# Patient Record
Sex: Male | Born: 1990 | Race: White | Hispanic: No | Marital: Married | Smoking: Never smoker
Health system: Southern US, Community
[De-identification: ages and names within clinical notes are randomized; demographics above are authoritative.]

## PROBLEM LIST (undated history)

## (undated) DIAGNOSIS — N200 Calculus of kidney: Secondary | ICD-10-CM

## (undated) DIAGNOSIS — M1990 Unspecified osteoarthritis, unspecified site: Secondary | ICD-10-CM

## (undated) DIAGNOSIS — T7840XA Allergy, unspecified, initial encounter: Secondary | ICD-10-CM

## (undated) DIAGNOSIS — F419 Anxiety disorder, unspecified: Secondary | ICD-10-CM

## (undated) HISTORY — DX: Unspecified osteoarthritis, unspecified site: M19.90

## (undated) HISTORY — DX: Anxiety disorder, unspecified: F41.9

## (undated) HISTORY — PX: FRACTURE SURGERY: SHX138

## (undated) HISTORY — DX: Allergy, unspecified, initial encounter: T78.40XA

---

## 2016-12-15 ENCOUNTER — Emergency Department (HOSPITAL_COMMUNITY): Payer: BLUE CROSS/BLUE SHIELD

## 2016-12-15 ENCOUNTER — Emergency Department (HOSPITAL_COMMUNITY)
Admission: EM | Admit: 2016-12-15 | Discharge: 2016-12-15 | Disposition: A | Discharge status: Home / Self Care | Payer: BLUE CROSS/BLUE SHIELD | Attending: Emergency Medicine | Admitting: Emergency Medicine

## 2016-12-15 ENCOUNTER — Encounter (HOSPITAL_COMMUNITY): Payer: Self-pay | Admitting: Emergency Medicine

## 2016-12-15 DIAGNOSIS — Z79899 Other long term (current) drug therapy: Secondary | ICD-10-CM | POA: Diagnosis not present

## 2016-12-15 DIAGNOSIS — R109 Unspecified abdominal pain: Secondary | ICD-10-CM | POA: Diagnosis present

## 2016-12-15 DIAGNOSIS — N201 Calculus of ureter: Secondary | ICD-10-CM

## 2016-12-15 HISTORY — DX: Calculus of kidney: N20.0

## 2016-12-15 LAB — URINALYSIS, ROUTINE W REFLEX MICROSCOPIC
BACTERIA UA: NONE SEEN
BILIRUBIN URINE: NEGATIVE
Glucose, UA: NEGATIVE mg/dL
Ketones, ur: NEGATIVE mg/dL
Leukocytes, UA: NEGATIVE
Nitrite: NEGATIVE
PH: 5 (ref 5.0–8.0)
Protein, ur: 30 mg/dL — AB
SPECIFIC GRAVITY, URINE: 1.024 (ref 1.005–1.030)

## 2016-12-15 LAB — CBC WITH DIFFERENTIAL/PLATELET
BASOS PCT: 0 %
Basophils Absolute: 0 10*3/uL (ref 0.0–0.1)
EOS ABS: 0.1 10*3/uL (ref 0.0–0.7)
Eosinophils Relative: 0 %
HEMATOCRIT: 39.1 % (ref 39.0–52.0)
HEMOGLOBIN: 13.2 g/dL (ref 13.0–17.0)
LYMPHS ABS: 1.2 10*3/uL (ref 0.7–4.0)
Lymphocytes Relative: 9 %
MCH: 29.3 pg (ref 26.0–34.0)
MCHC: 33.8 g/dL (ref 30.0–36.0)
MCV: 86.9 fL (ref 78.0–100.0)
MONOS PCT: 7 %
Monocytes Absolute: 0.9 10*3/uL (ref 0.1–1.0)
NEUTROS ABS: 10.6 10*3/uL — AB (ref 1.7–7.7)
Neutrophils Relative %: 84 %
Platelets: 179 10*3/uL (ref 150–400)
RBC: 4.5 MIL/uL (ref 4.22–5.81)
RDW: 12.8 % (ref 11.5–15.5)
WBC: 12.8 10*3/uL — AB (ref 4.0–10.5)

## 2016-12-15 LAB — COMPREHENSIVE METABOLIC PANEL
ALBUMIN: 4.6 g/dL (ref 3.5–5.0)
ALK PHOS: 59 U/L (ref 38–126)
ALT: 59 U/L (ref 17–63)
AST: 43 U/L — ABNORMAL HIGH (ref 15–41)
Anion gap: 7 (ref 5–15)
BILIRUBIN TOTAL: 0.7 mg/dL (ref 0.3–1.2)
BUN: 20 mg/dL (ref 6–20)
CALCIUM: 8.8 mg/dL — AB (ref 8.9–10.3)
CO2: 26 mmol/L (ref 22–32)
CREATININE: 1.1 mg/dL (ref 0.61–1.24)
Chloride: 103 mmol/L (ref 101–111)
GFR calc Af Amer: >60 mL/min (ref >60)
GFR calc non Af Amer: >60 mL/min (ref >60)
Glucose, Bld: 130 mg/dL — ABNORMAL HIGH (ref 65–99)
Potassium: 3.7 mmol/L (ref 3.5–5.1)
SODIUM: 136 mmol/L (ref 135–145)
TOTAL PROTEIN: 6.8 g/dL (ref 6.5–8.1)

## 2016-12-15 LAB — LIPASE, BLOOD: Lipase: 28 U/L (ref 11–51)

## 2016-12-15 MED ORDER — IBUPROFEN 600 MG PO TABS: 600.0000 mg | ORAL_TABLET | Freq: Four times a day (QID) | ORAL | 0 refills | Status: DC | PRN

## 2016-12-15 MED ORDER — KETOROLAC TROMETHAMINE 30 MG/ML IJ SOLN
30.0000 mg | Freq: Once | INTRAMUSCULAR | Status: AC
  Administered 2016-12-15: 30 mg via INTRAVENOUS
  Filled 2016-12-15: qty 1

## 2016-12-15 MED ORDER — OXYCODONE-ACETAMINOPHEN 5-325 MG PO TABS: 1.0000 | ORAL_TABLET | ORAL | 0 refills | Status: DC | PRN

## 2016-12-15 MED ORDER — HYDROMORPHONE HCL 1 MG/ML IJ SOLN
1.0000 mg | Freq: Once | INTRAMUSCULAR | Status: AC
  Administered 2016-12-15: 1 mg via INTRAVENOUS
  Filled 2016-12-15: qty 1

## 2016-12-15 MED ORDER — ONDANSETRON 4 MG PO TBDP: 4.0000 mg | ORAL_TABLET | Freq: Three times a day (TID) | ORAL | 0 refills | Status: DC | PRN

## 2016-12-15 MED ORDER — TAMSULOSIN HCL 0.4 MG PO CAPS: 0.4000 mg | ORAL_CAPSULE | Freq: Two times a day (BID) | ORAL | 0 refills | Status: DC

## 2016-12-15 NOTE — ED Notes (Signed)
Pt encouraged to attempt urine sample.  

## 2016-12-15 NOTE — ED Provider Notes (Signed)
[facility]-EMERGENCY DEPT Provider Note   CSN: [id number] Arrival date & time: 12/15/16  0506     History   Chief Complaint Chief Complaint  Patient presents with  . Flank Pain    HPI Derrick Pena is a 26 y.o. male who presents with L flank pain. PMH significant for kidney stones. He states he started to have the pain acutely last night. Reports associated N/V which attributes to pain. It is constant, on the L side, non-radiating. Reports associated urinary frequency and urgency. Denies fever, chills, chest pain, SOB, abdominal pain, testicular pain. He does not have a urologist. Has never had to have a stone surgically removed.   HPI  Past Medical History:  Diagnosis Date  . Kidney stones     There are no active problems to display for this patient.   Past Surgical History:  Procedure Laterality Date  . FRACTURE SURGERY         Home Medications    Prior to Admission medications   Medication Sig Start Date End Date Taking? Authorizing Provider  clonazePAM (KLONOPIN) 0.5 MG tablet Take 0.5 mg by mouth daily as needed for anxiety.    Yes Historical Provider, MD  meloxicam (MOBIC) 7.5 MG tablet Take 7.5 mg by mouth daily as needed for pain.    Yes Historical Provider, MD    Family History No family history on file.  Social History Social History  Substance Use Topics  . Smoking status: Never Smoker  . Smokeless tobacco: Never Used  . Alcohol use No     Comment: seldom     Allergies   Patient has no known allergies.   Review of Systems Review of Systems  Constitutional: Negative for chills and fever.  Respiratory: Negative for shortness of breath.   Cardiovascular: Negative for chest pain.  Gastrointestinal: Positive for nausea and vomiting. Negative for abdominal pain.  Genitourinary: Positive for flank pain, frequency and urgency. Negative for testicular pain.  All other systems reviewed and are negative.    Physical Exam Updated Vital Signs BP  113/77   Pulse 83   Temp 97.6 F (36.4 C) (Oral)   Resp 16   Ht 6\' 4"  (1.93 m)   Wt 71.2 kg   SpO2 100%   BMI 19.11 kg/m   Physical Exam  Constitutional: He is oriented to person, place, and time. He appears well-developed and well-nourished. He appears distressed (mild distress due to pain).  HENT:  Head: Normocephalic and atraumatic.  Eyes: Conjunctivae are normal. Pupils are equal, round, and reactive to light. Right eye exhibits no discharge. Left eye exhibits no discharge. No scleral icterus.  Neck: Normal range of motion.  Cardiovascular: Normal rate and regular rhythm.  Exam reveals no gallop and no friction rub.   No murmur heard. Pulmonary/Chest: Effort normal and breath sounds normal. No respiratory distress. He has no wheezes. He has no rales. He exhibits no tenderness.  Abdominal: Soft. Bowel sounds are normal. He exhibits no distension and no mass. There is tenderness. There is no rebound and no guarding. No hernia.  L CVA tenderness  Neurological: He is alert and oriented to person, place, and time.  Skin: Skin is warm and dry.  Psychiatric: He has a normal mood and affect. His behavior is normal.  Nursing note and vitals reviewed.    ED Treatments / Results  Labs (all labs ordered are listed, but only abnormal results are displayed) Labs Reviewed  URINALYSIS, ROUTINE W REFLEX MICROSCOPIC - Abnormal; Notable for  the following:       Result Value   APPearance HAZY (*)    Hgb urine dipstick LARGE (*)    Protein, ur 30 (*)    Squamous Epithelial / LPF 0-5 (*)    All other components within normal limits  CBC WITH DIFFERENTIAL/PLATELET - Abnormal; Notable for the following:    WBC 12.8 (*)    Neutro Abs 10.6 (*)    All other components within normal limits  COMPREHENSIVE METABOLIC PANEL - Abnormal; Notable for the following:    Glucose, Bld 130 (*)    Calcium 8.8 (*)    AST 43 (*)    All other components within normal limits  LIPASE, BLOOD    EKG  EKG  Interpretation None       Radiology Ct Renal Stone Study  Result Date: 12/15/2016 CLINICAL DATA:  Left flank pain for several hours, elevated white blood cell count EXAM: CT ABDOMEN AND PELVIS WITHOUT CONTRAST TECHNIQUE: Multidetector CT imaging of the abdomen and pelvis was performed following the standard protocol without IV contrast. COMPARISON:  None. FINDINGS: Lower chest: No acute abnormality. Hepatobiliary: No focal liver abnormality is seen. No gallstones, gallbladder wall thickening, or biliary dilatation. Pancreas: Unremarkable. No pancreatic ductal dilatation or surrounding inflammatory changes. Spleen: Normal in size without focal abnormality. Adrenals/Urinary Tract: Adrenal glands are within normal limits. The kidneys are well visualized bilaterally. Tiny nonobstructing renal stones are noted in the lower poles bilaterally. Only minimal fullness of the left collecting system is noted although a 5 mm stone is noted at the left UVJ best seen on image number 67 of series 2 causing mild obstructive changes. The bladder is partially decompressed. Stomach/Bowel: Stomach is within normal limits. Appendix appears normal. No evidence of bowel wall thickening, distention, or inflammatory changes. Vascular/Lymphatic: No significant vascular findings are present. No enlarged abdominal or pelvic lymph nodes. Reproductive: Prostate is unremarkable. Other: No abdominal wall hernia or abnormality. No abdominopelvic ascites. Musculoskeletal: No acute or significant osseous findings. IMPRESSION: Bilateral nonobstructing lower pole renal calculi measuring 1-2 mm in diameter. 5 mm left UVJ stone with mild obstructive changes. No other focal abnormality is noted. Electronically Signed   By: Alcide Clever M.D.   On: 12/15/2016 07:07    Procedures Procedures (including critical care time)  Medications Ordered in ED Medications  ketorolac (TORADOL) 30 MG/ML injection 30 mg (30 mg Intravenous Given 12/15/16 1610)   HYDROmorphone (DILAUDID) injection 1 mg (1 mg Intravenous Given 12/15/16 9604)     Initial Impression / Assessment and Plan / ED Course  I have reviewed the triage vital signs and the nursing notes.  Pertinent labs & imaging results that were available during my care of the patient were reviewed by me and considered in my medical decision making (see chart for details).  Clinical Course    26 year old male presents with a left uretal stone. Patient is afebrile, not tachycardic or tachypneic, normotensive, and not hypoxic. CBC remarkable for mild leukocytosis (12.8), CMP remarkable for mild hyperglycemia (130), mild hypocalcemia (8.8), mildly elevated AST (43). Kidney function is normal. UA remarkable for hgb with too numerous to count RBCs. Dilaudid and Toradol given. CT renal remarkable for 5mm left UVJ stone.  On recheck pt reports pain is 0. Will d/c with pain meds, NSAIDs, Flomax, and Zofran. Urology follow up given. Patient is NAD, non-toxic, with stable VS. Patient is informed of clinical course, understands medical decision making process, and agrees with plan. Opportunity for questions provided  and all questions answered. Return precautions given.    Final Clinical Impressions(s) / ED Diagnoses   Final diagnoses:  Left ureteral stone    New Prescriptions New Prescriptions   IBUPROFEN (ADVIL,MOTRIN) 600 MG TABLET    Take 1 tablet (600 mg total) by mouth every 6 (six) hours as needed.   ONDANSETRON (ZOFRAN ODT) 4 MG DISINTEGRATING TABLET    Take 1 tablet (4 mg total) by mouth every 8 (eight) hours as needed for nausea or vomiting.   OXYCODONE-ACETAMINOPHEN (PERCOCET/ROXICET) 5-325 MG TABLET    Take 1 tablet by mouth every 4 (four) hours as needed for severe pain.   TAMSULOSIN (FLOMAX) 0.4 MG CAPS CAPSULE    Take 1 capsule (0.4 mg total) by mouth 2 (two) times daily.     Bethel BornKelly Marie Kiernan Atkerson, PA-C 12/15/16 [id number]    Tilden FossaElizabeth Rees, MD 12/24/16 [phone]

## 2016-12-15 NOTE — Discharge Instructions (Signed)
Drink plenty of fluids °Take narcotic pain medicine as needed. Do not drink alcohol or drive while taking this medicine °Ibuprofen 600mg - Take for pain and inflammation. Take with food three times daily °Take Zofran for nausea °Take Flomax to help stone pass °Return if symptoms are worsening ° °

## 2016-12-15 NOTE — ED Triage Notes (Signed)
Pt c/o flank pain that began about an hour ago; pt endorses urinary urgency and frequency; pt had 1 episode of emesis with EMS; hx of kidney stones

## 2018-08-17 IMAGING — CT CT RENAL STONE PROTOCOL
2 of 3 series · 16 of 46 positions shown, 18 images · non-contrast
Comparison: None.

CLINICAL DATA: Left flank pain for several hours, elevated white
blood cell count

EXAM:
CT ABDOMEN AND PELVIS WITHOUT CONTRAST
TECHNIQUE: Multidetector CT imaging of the abdomen and pelvis was performed
following the standard protocol without IV contrast.

[Series 3: lung · axial · 0.69mm/px · z∈[+1316,+1382]mm · 13 of 39 slices shown, 15 images]
[im 3/39  soft-tissue]
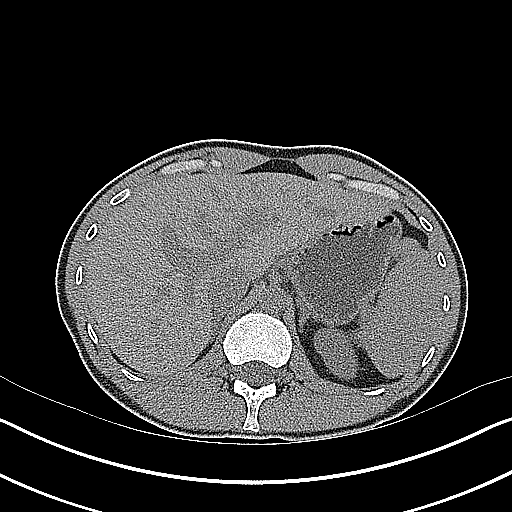
[im 3/39  bone]
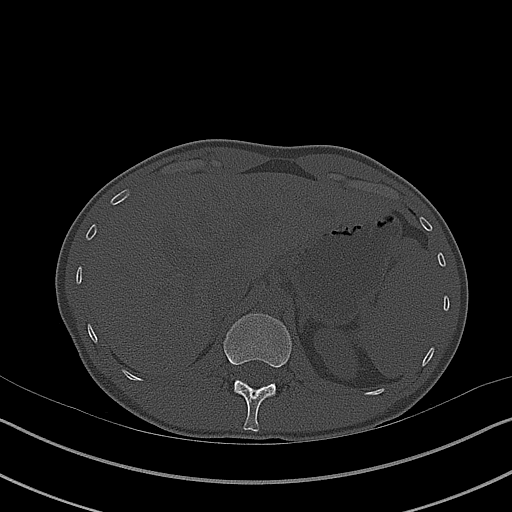
[im 5/39  soft-tissue]
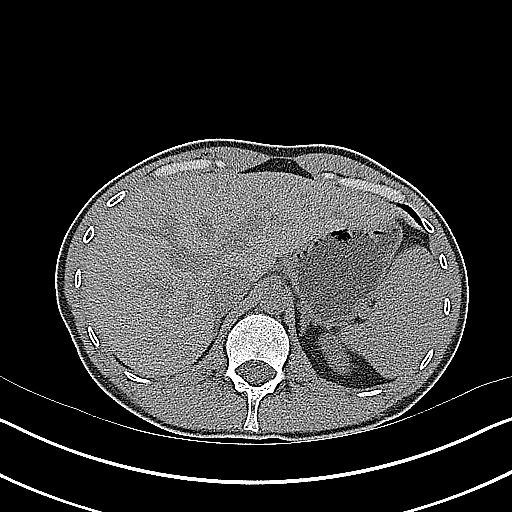
[im 8/39  soft-tissue]
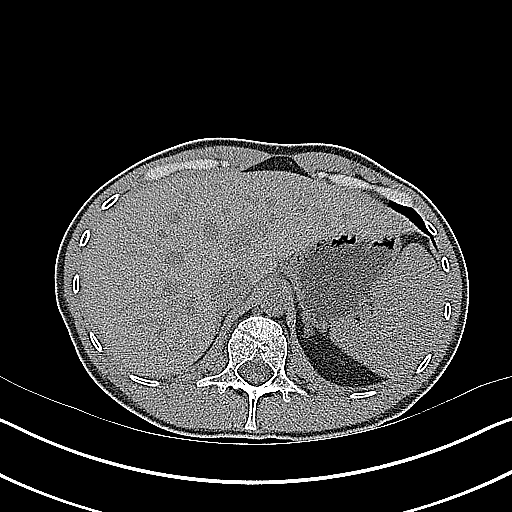
[im 12/39  soft-tissue]
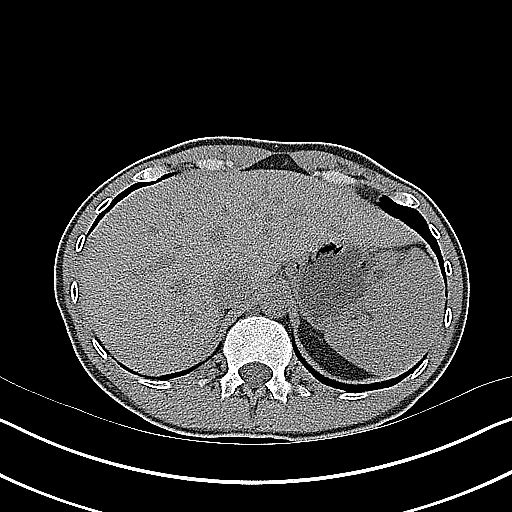
[im 14/39  soft-tissue]
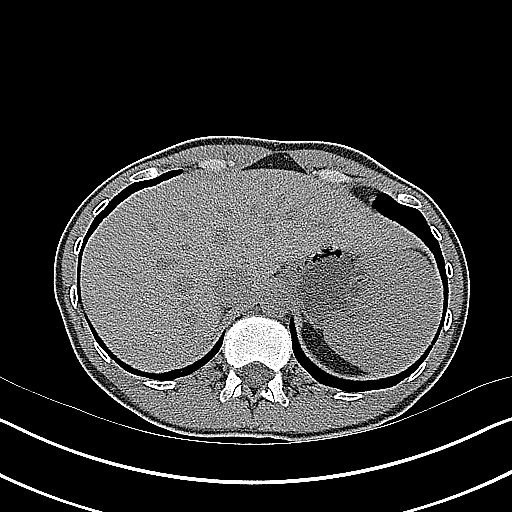
[im 16/39  soft-tissue]
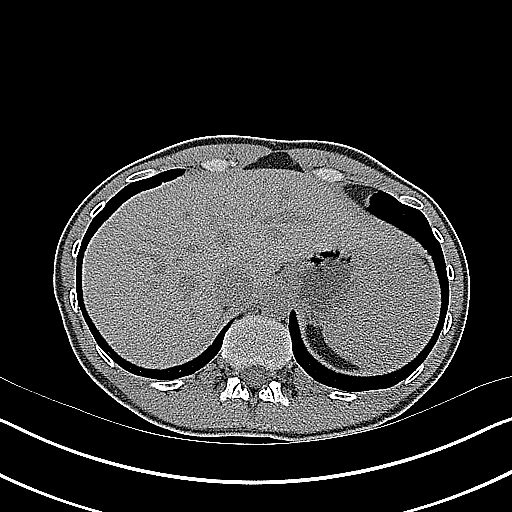
[im 20/39  soft-tissue]
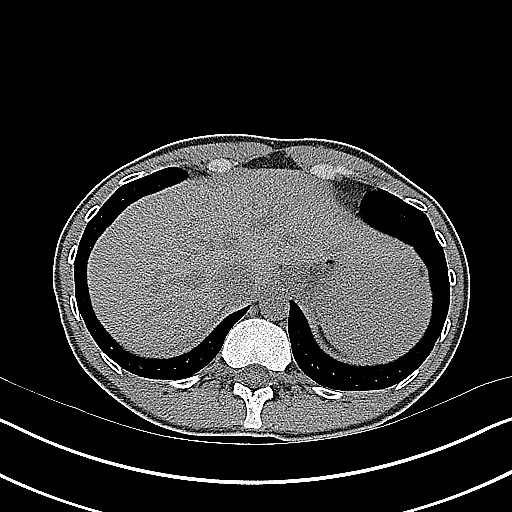
[im 23/39  soft-tissue]
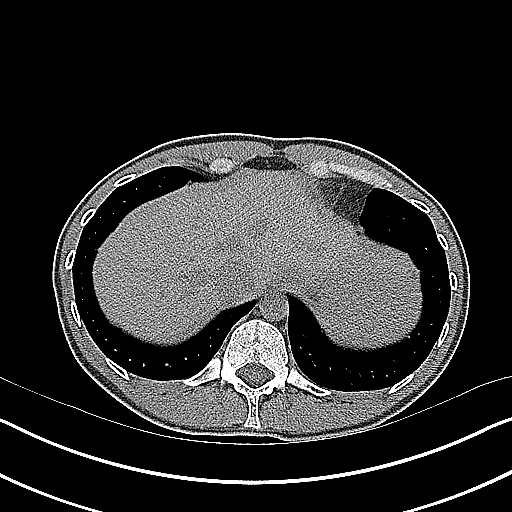
[im 25/39  soft-tissue]
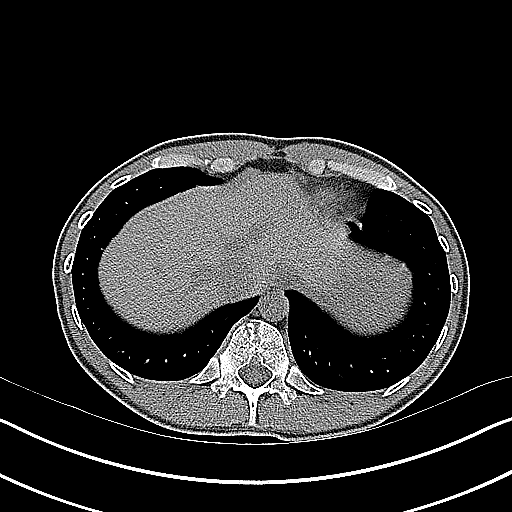
[im 25/39  bone]
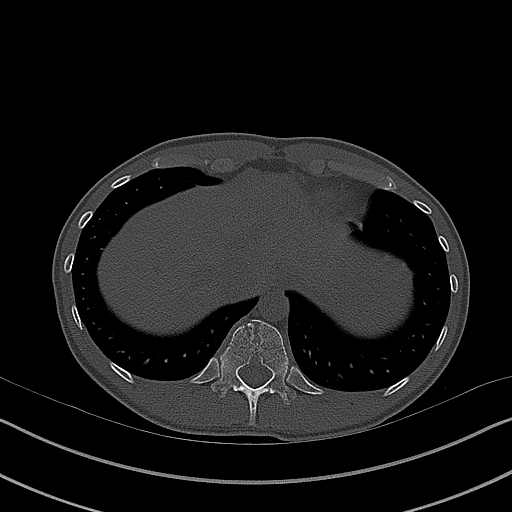
[im 27/39  soft-tissue]
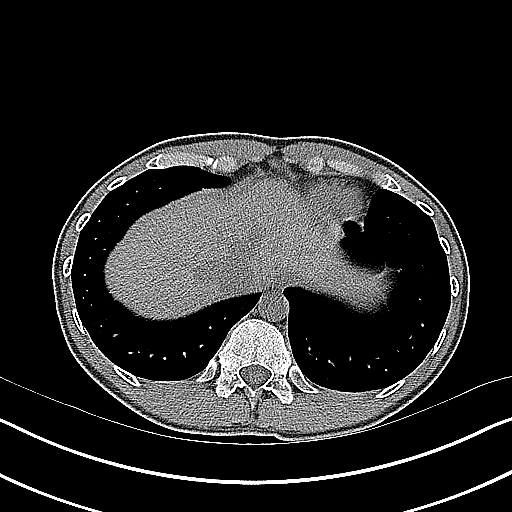
[im 31/39  soft-tissue]
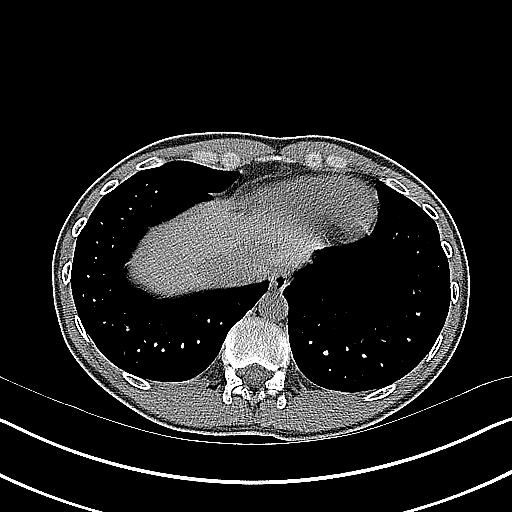
[im 34/39  soft-tissue]
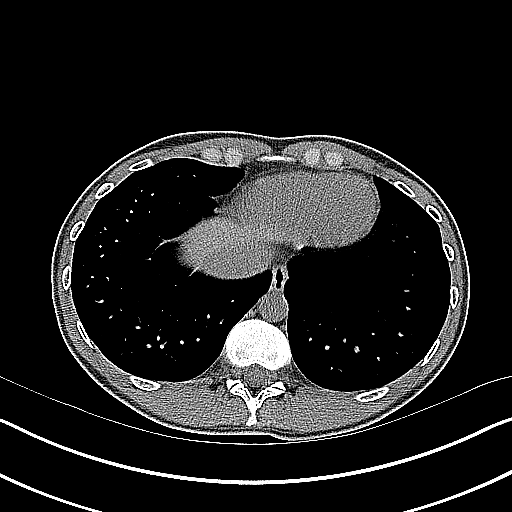
[im 36/39  soft-tissue]
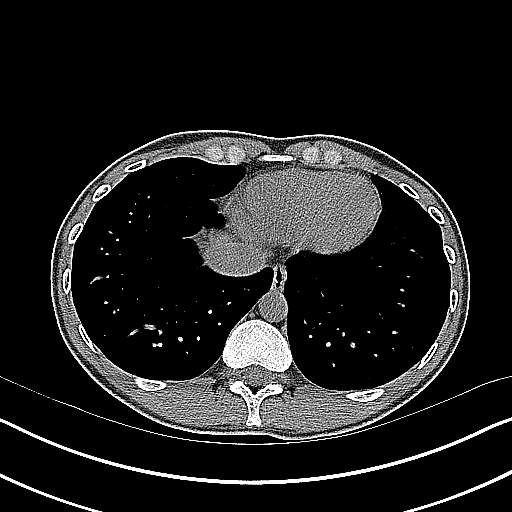

[Series 4: coronal · coronal · 0.66mm/px · 3 of 98 slices shown]
[im 33/98  soft-tissue]
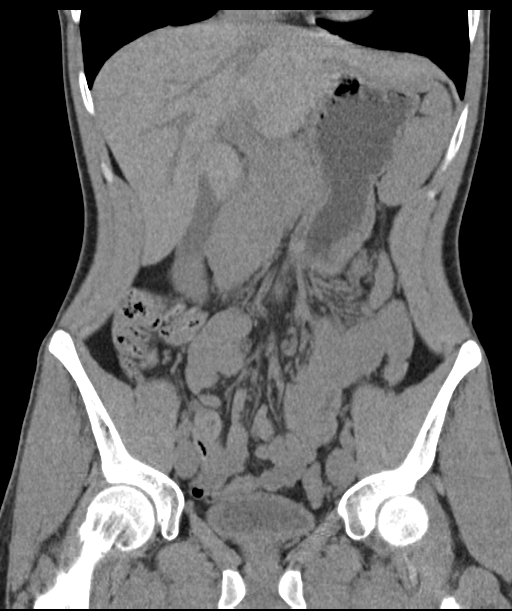
[im 44/98  soft-tissue]
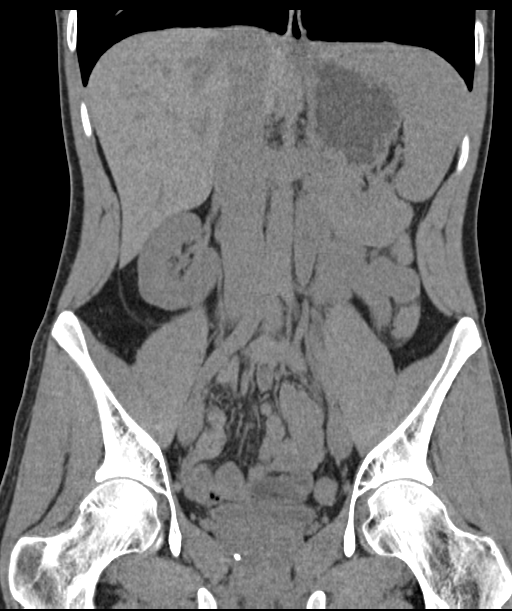
[im 54/98  soft-tissue]
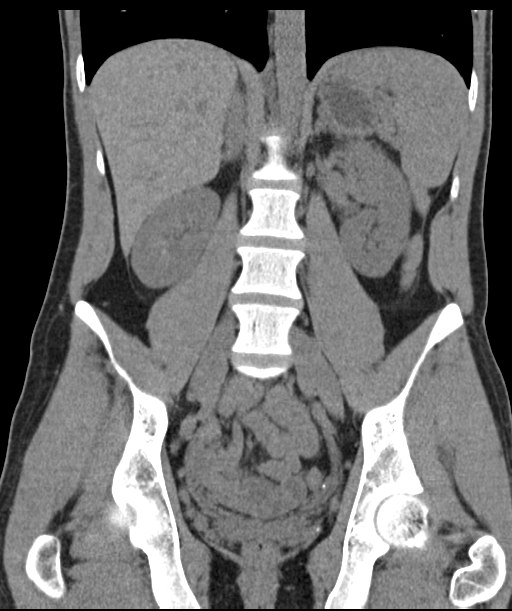

[16 of 46 positions shown; findings below may reference images not displayed]

FINDINGS: Lower chest: No acute abnormality.

Hepatobiliary: No focal liver abnormality is seen. No gallstones,
gallbladder wall thickening, or biliary dilatation.

Pancreas: Unremarkable. No pancreatic ductal dilatation or
surrounding inflammatory changes.

Spleen: Normal in size without focal abnormality.

Adrenals/Urinary Tract: Adrenal glands are within normal limits. The
kidneys are well visualized bilaterally. Tiny nonobstructing renal
stones are noted in the lower poles bilaterally. Only minimal
fullness of the left collecting system is noted although a 5 mm
stone is noted at the left UVJ best seen on image number 67 of
series 2 causing mild obstructive changes. The bladder is partially
decompressed.

Stomach/Bowel: Stomach is within normal limits. Appendix appears
normal. No evidence of bowel wall thickening, distention, or
inflammatory changes.

Vascular/Lymphatic: No significant vascular findings are present. No
enlarged abdominal or pelvic lymph nodes.

Reproductive: Prostate is unremarkable.

Other: No abdominal wall hernia or abnormality. No abdominopelvic
ascites.

Musculoskeletal: No acute or significant osseous findings.
IMPRESSION: Bilateral nonobstructing lower pole renal calculi measuring 1-2 mm
in diameter.

5 mm left UVJ stone with mild obstructive changes. No other focal
abnormality is noted.

## 2023-06-23 DIAGNOSIS — J309 Allergic rhinitis, unspecified: Secondary | ICD-10-CM | POA: Diagnosis not present

## 2023-06-23 DIAGNOSIS — R0981 Nasal congestion: Secondary | ICD-10-CM | POA: Diagnosis not present

## 2023-06-23 DIAGNOSIS — R059 Cough, unspecified: Secondary | ICD-10-CM | POA: Diagnosis not present

## 2024-02-29 NOTE — Progress Notes (Signed)
 Requested records from [facility] and [facility]

## 2024-03-04 ENCOUNTER — Encounter: Payer: Self-pay | Admitting: Nurse Practitioner

## 2024-03-04 ENCOUNTER — Ambulatory Visit (INDEPENDENT_AMBULATORY_CARE_PROVIDER_SITE_OTHER): Payer: Self-pay | Admitting: Nurse Practitioner

## 2024-03-04 VITALS — BP 108/78 | HR 68 | Temp 97.5°F | Ht 76.0 in | Wt 163.0 lb

## 2024-03-04 DIAGNOSIS — G8929 Other chronic pain: Secondary | ICD-10-CM | POA: Insufficient documentation

## 2024-03-04 DIAGNOSIS — M25572 Pain in left ankle and joints of left foot: Secondary | ICD-10-CM

## 2024-03-04 DIAGNOSIS — F411 Generalized anxiety disorder: Secondary | ICD-10-CM | POA: Insufficient documentation

## 2024-03-04 DIAGNOSIS — G5603 Carpal tunnel syndrome, bilateral upper limbs: Secondary | ICD-10-CM | POA: Insufficient documentation

## 2024-03-04 LAB — HM HEPATITIS C SCREENING LAB: HM Hepatitis Screen: NEGATIVE

## 2024-03-04 LAB — HM HIV SCREENING LAB: HM HIV Screening: NEGATIVE

## 2024-03-04 MED ORDER — DULOXETINE HCL 30 MG PO CPEP: 30.0000 mg | ORAL_CAPSULE | Freq: Every day | ORAL | 1 refills | Status: DC

## 2024-03-04 NOTE — Assessment & Plan Note (Signed)
 Chronic bilateral numbness and tingling in fingers and arms likely result from nerve compression, with differential diagnoses including carpal tunnel syndrome or cervical radiculopathy. Refer to orthopedics for evaluation. Prescribe duloxetine 30 capsule daily for nerve pain and anxiety. Recommend wearing a wrist brace at night and during the day if feasible. Recent B12, A1c, ANA, RF negative/normal. Follow-up in 4-6 weeks.

## 2024-03-04 NOTE — Assessment & Plan Note (Signed)
 Chronic left ankle popping occurs without pain or swelling and is likely benign. Recommend daily alphabet exercises to strengthen ankle tendons. Advise monitoring for any pain, swelling, or instability. X-ray negative.

## 2024-03-04 NOTE — Patient Instructions (Addendum)
 It was great to see you!  Wear a wrist brace at night (you can wear during the day as well)   Start cymbalta 1 capsule daily  Spell the alphabet with your foot daily   I am placing a referral to ortho   Let's follow-up in 4-6 weeks, sooner if you have concerns.  If a referral was placed today, you will be contacted for an appointment. Please note that routine referrals can sometimes take up to 3-4 weeks to process. Please call our office if you haven't heard anything after this time frame.  Take care,  Rodman Pickle, NP

## 2024-03-04 NOTE — Assessment & Plan Note (Signed)
 He experiences mild anxiety related to health issues. Duloxetine 30mg  daily is prescribed for both anxiety and nerve pain. Discussed possible side effects. Follow-up in 4-6 weeks.

## 2024-03-04 NOTE — Progress Notes (Signed)
 New Patient Visit  BP 108/78 (BP Location: Left Arm, Patient Position: Sitting, Cuff Size: Normal)   Pulse 68   Temp (!) 97.5 F (36.4 C)   Ht 6\' 4"  (1.93 m)   Wt 163 lb (73.9 kg)   SpO2 97%   BMI 19.84 kg/m    Subjective:    Patient ID: Derrick Pena, male    DOB: Apr 07, 1991, 33 y.o.   MRN: [id number]  CC: Chief Complaint  Patient presents with   Establish Care    NP. Est. Care, concerns with numbness and tingling in hands and fingers and legs-constant lifting, standing at work, popping at back of ankles, anxiety    HPI: Derrick Pena is a 33 y.o. male presents for new patient visit to establish care.  Introduced to Publishing rights manager role and practice setting.  All questions answered.  Discussed provider/patient relationship and expectations.  Discussed the use of AI scribe software for clinical note transcription with the patient, who gave verbal consent to proceed.  History of Present Illness   Derrick Pena, a patient with a history of kidney stones and a previous arm surgery, presents with a chief complaint of persistent numbness and tingling in his hands that has been ongoing for about a year. The numbness is more pronounced in the pointer fingers but affects all fingers. He also experiences stiffness in the joints. The symptoms are constant and do not come and go. However, they seem to improve with physical activity and worsen when he is at rest, leading to increased stiffness.  In addition to the hand symptoms, he also reports a constant popping in the left ankle. There is no associated pain or swelling, and he has had an x-ray done previously which did not reveal any significant findings. He also mentions feeling fatigued and experiencing some anxiety, but denies any other significant symptoms.  His work involves physical labor, including lifting, which could potentially contribute to the symptoms. He has tried over-the-counter medication for the numbness and tingling,  which provided some relief. He has not had any recent falls or injuries, and denies any significant changes in lifestyle or habits.        03/04/2024    1:18 PM  Depression screen PHQ 2/9  Decreased Interest 0  Down, Depressed, Hopeless 0  PHQ - 2 Score 0  Altered sleeping 1  Tired, decreased energy 1  Change in appetite 0  Feeling bad or failure about yourself  0  Trouble concentrating 0  Moving slowly or fidgety/restless 0  Suicidal thoughts 0  PHQ-9 Score 2  Difficult doing work/chores Somewhat difficult      03/04/2024    1:18 PM  GAD 7 : Generalized Anxiety Score  Nervous, Anxious, on Edge 1  Control/stop worrying 1  Worry too much - different things 1  Trouble relaxing 1  Restless 0  Easily annoyed or irritable 0  Afraid - awful might happen 0  Total GAD 7 Score 4  Anxiety Difficulty Somewhat difficult    Past Medical History:  Diagnosis Date   Allergy    Anxiety    Arthritis    Kidney stones     Past Surgical History:  Procedure Laterality Date   FRACTURE SURGERY      Family History  Problem Relation Age of Onset   Heart attack Mother      Social History   Tobacco Use   Smoking status: Never   Smokeless tobacco: Never  Vaping Use   Vaping status: Never  Used  Substance Use Topics   Alcohol use: No    Comment: seldom   Drug use: No    No current outpatient medications on file prior to visit.   No current facility-administered medications on file prior to visit.     Review of Systems  Constitutional:  Positive for fatigue. Negative for fever.  HENT:  Positive for congestion. Negative for sinus pain and sore throat.   Respiratory: Negative.    Cardiovascular: Negative.   Gastrointestinal: Negative.   Genitourinary: Negative.   Musculoskeletal:        Left ankle popping  Skin: Negative.   Neurological:  Positive for numbness (and tingling in hands). Negative for dizziness.  Psychiatric/Behavioral:  Negative for dysphoric mood. The  patient is nervous/anxious.       Objective:    BP 108/78 (BP Location: Left Arm, Patient Position: Sitting, Cuff Size: Normal)   Pulse 68   Temp (!) 97.5 F (36.4 C)   Ht 6\' 4"  (1.93 m)   Wt 163 lb (73.9 kg)   SpO2 97%   BMI 19.84 kg/m   Wt Readings from Last 3 Encounters:  03/04/24 163 lb (73.9 kg)  12/15/16 157 lb (71.2 kg)    BP Readings from Last 3 Encounters:  03/04/24 108/78  12/15/16 108/58    Physical Exam Vitals and nursing note reviewed.  Constitutional:      General: He is not in acute distress.    Appearance: Normal appearance.  HENT:     Head: Normocephalic and atraumatic.  Eyes:     Conjunctiva/sclera: Conjunctivae normal.  Cardiovascular:     Rate and Rhythm: Normal rate and regular rhythm.     Pulses: Normal pulses.     Heart sounds: Normal heart sounds.  Pulmonary:     Effort: Pulmonary effort is normal.     Breath sounds: Normal breath sounds.  Abdominal:     Palpations: Abdomen is soft.     Tenderness: There is no abdominal tenderness.  Musculoskeletal:        General: Normal range of motion.     Cervical back: Normal range of motion and neck supple. No tenderness.     Right lower leg: No edema.     Left lower leg: No edema.     Comments: Phalen's test positive bilaterally, spurlings test negative.   Lymphadenopathy:     Cervical: No cervical adenopathy.  Skin:    General: Skin is warm and dry.  Neurological:     General: No focal deficit present.     Mental Status: He is alert and oriented to person, place, and time.     Cranial Nerves: No cranial nerve deficit.     Coordination: Coordination normal.     Gait: Gait normal.  Psychiatric:        Mood and Affect: Mood normal.        Behavior: Behavior normal.        Thought Content: Thought content normal.        Judgment: Judgment normal.        Assessment & Plan:   Problem List Items Addressed This Visit       Nervous and Auditory   Bilateral carpal tunnel syndrome - Primary    Chronic bilateral numbness and tingling in fingers and arms likely result from nerve compression, with differential diagnoses including carpal tunnel syndrome or cervical radiculopathy. Refer to orthopedics for evaluation. Prescribe duloxetine 30 capsule daily for nerve pain and anxiety. Recommend wearing a  wrist brace at night and during the day if feasible. Recent B12, A1c, ANA, RF negative/normal. Follow-up in 4-6 weeks.      Relevant Medications   DULoxetine (CYMBALTA) 30 MG capsule   Other Relevant Orders   Ambulatory referral to Orthopedic Surgery     Other   Generalized anxiety disorder   He experiences mild anxiety related to health issues. Duloxetine 30mg  daily is prescribed for both anxiety and nerve pain. Discussed possible side effects. Follow-up in 4-6 weeks.      Relevant Medications   DULoxetine (CYMBALTA) 30 MG capsule   Chronic pain of left ankle   Chronic left ankle popping occurs without pain or swelling and is likely benign. Recommend daily alphabet exercises to strengthen ankle tendons. Advise monitoring for any pain, swelling, or instability. X-ray negative.       Relevant Medications   DULoxetine (CYMBALTA) 30 MG capsule     Follow up plan: Return in about 4 weeks (around 04/01/2024) for 4-6 weeks , CPE.  Donovan Gatchel A Damin Salido

## 2024-04-10 ENCOUNTER — Encounter: Payer: Self-pay | Admitting: Nurse Practitioner

## 2024-04-10 ENCOUNTER — Ambulatory Visit (INDEPENDENT_AMBULATORY_CARE_PROVIDER_SITE_OTHER): Payer: Self-pay | Admitting: Nurse Practitioner

## 2024-04-10 VITALS — BP 120/80 | HR 54 | Temp 97.1°F | Ht 76.0 in | Wt 162.0 lb

## 2024-04-10 DIAGNOSIS — G8929 Other chronic pain: Secondary | ICD-10-CM

## 2024-04-10 DIAGNOSIS — M25572 Pain in left ankle and joints of left foot: Secondary | ICD-10-CM

## 2024-04-10 DIAGNOSIS — Z136 Encounter for screening for cardiovascular disorders: Secondary | ICD-10-CM

## 2024-04-10 DIAGNOSIS — G5603 Carpal tunnel syndrome, bilateral upper limbs: Secondary | ICD-10-CM

## 2024-04-10 DIAGNOSIS — F411 Generalized anxiety disorder: Secondary | ICD-10-CM

## 2024-04-10 DIAGNOSIS — Z0000 Encounter for general adult medical examination without abnormal findings: Secondary | ICD-10-CM

## 2024-04-10 LAB — CBC WITH DIFFERENTIAL/PLATELET
Basophils Absolute: 0.1 10*3/uL (ref 0.0–0.1)
Basophils Relative: 1.2 % (ref 0.0–3.0)
Eosinophils Absolute: 0.2 10*3/uL (ref 0.0–0.7)
Eosinophils Relative: 2.5 % (ref 0.0–5.0)
HCT: 43.2 % (ref 39.0–52.0)
Hemoglobin: 14.1 g/dL (ref 13.0–17.0)
Lymphocytes Relative: 23.7 % (ref 12.0–46.0)
Lymphs Abs: 1.6 10*3/uL (ref 0.7–4.0)
MCHC: 32.7 g/dL (ref 30.0–36.0)
MCV: 88.3 fl (ref 78.0–100.0)
Monocytes Absolute: 0.5 10*3/uL (ref 0.1–1.0)
Monocytes Relative: 7.9 % (ref 3.0–12.0)
Neutro Abs: 4.3 10*3/uL (ref 1.4–7.7)
Neutrophils Relative %: 64.7 % (ref 43.0–77.0)
Platelets: 221 10*3/uL (ref 150.0–400.0)
RBC: 4.89 Mil/uL (ref 4.22–5.81)
RDW: 13.2 % (ref 11.5–15.5)
WBC: 6.6 10*3/uL (ref 4.0–10.5)

## 2024-04-10 LAB — LIPID PANEL
Cholesterol: 143 mg/dL (ref 0–200)
HDL: 47.1 mg/dL (ref >39.00)
LDL Cholesterol: 90 mg/dL (ref 0–99)
NonHDL: 95.81
Total CHOL/HDL Ratio: 3
Triglycerides: 29 mg/dL (ref 0.0–149.0)
VLDL: 5.8 mg/dL (ref 0.0–40.0)

## 2024-04-10 LAB — COMPREHENSIVE METABOLIC PANEL WITH GFR
ALT: 11 U/L (ref 0–53)
AST: 14 U/L (ref 0–37)
Albumin: 4.7 g/dL (ref 3.5–5.2)
Alkaline Phosphatase: 72 U/L (ref 39–117)
BUN: 10 mg/dL (ref 6–23)
CO2: 31 meq/L (ref 19–32)
Calcium: 9.2 mg/dL (ref 8.4–10.5)
Chloride: 103 meq/L (ref 96–112)
Creatinine, Ser: 0.9 mg/dL (ref 0.40–1.50)
GFR: 110.93 mL/min (ref >60.00)
Glucose, Bld: 84 mg/dL (ref 70–99)
Potassium: 4.2 meq/L (ref 3.5–5.1)
Sodium: 139 meq/L (ref 135–145)
Total Bilirubin: 0.7 mg/dL (ref 0.2–1.2)
Total Protein: 6.9 g/dL (ref 6.0–8.3)

## 2024-04-10 LAB — TSH: TSH: 1.15 u[IU]/mL (ref 0.35–5.50)

## 2024-04-10 MED ORDER — SERTRALINE HCL 25 MG PO TABS: 25.0000 mg | ORAL_TABLET | Freq: Every day | ORAL | 1 refills | Status: DC

## 2024-04-10 NOTE — Patient Instructions (Signed)
 It was great to see you!  Please contact [facility] (Orthopedics) one of our offices have received a referral from your healthcare provider. And we are trying to reach you to schedule an evaluation appointment. Please contact me at [phone]   Tobie Fortis Referral Coordinator   Stop the duloxetine  and start zoloft 1 tablet daily.   We are checking your labs today and will let you know the results via mychart/phone.   Let's follow-up in 4-6 weeks, sooner if you have concerns.  If a referral was placed today, you will be contacted for an appointment. Please note that routine referrals can sometimes take up to 3-4 weeks to process. Please call our office if you haven't heard anything after this time frame.  Take care,  Rheba Cedar, NP

## 2024-04-10 NOTE — Assessment & Plan Note (Signed)
 His condition has improved with the use of a wrist brace. He is awaiting an orthopedic consultation. Continue using the wrist brace at night. Follow up with orthopedics.

## 2024-04-10 NOTE — Assessment & Plan Note (Addendum)
Health maintenance reviewed and updated. Discussed nutrition, exercise. Check CMP, CBC today. Follow-up 1 year.   

## 2024-04-10 NOTE — Progress Notes (Signed)
 BP 120/80 (BP Location: Left Arm, Patient Position: Sitting, Cuff Size: Normal)   Pulse (!) 54   Temp (!) 97.1 F (36.2 C)   Ht 6\' 4"  (1.93 m)   Wt 162 lb (73.5 kg)   SpO2 99%   BMI 19.72 kg/m    Subjective:    Patient ID: Derrick Pena, male    DOB: 1991/07/30, 33 y.o.   MRN: [id number]  CC: Chief Complaint  Patient presents with   Annual Exam    With fasting labs, concerns with medication and anxiety    HPI: Derrick Pena is a 33 y.o. male presenting on 04/10/2024 for comprehensive medical examination. Current medical complaints include: anxiety  Discussed the use of AI scribe software for clinical note transcription with the patient, who gave verbal consent to proceed.  History of Present Illness   Derrick Pena is a 33 year old male who presents with high anxiety and medication side effects.  He experiences high levels of anxiety and has previously used Cymbalta , which improved nerve pain and wrist strength but caused dizziness and cognitive fog, leading to discontinuation. He seeks a different medication to manage anxiety without adverse effects.  He denies panic attacks but experiences an upset stomach due to anxiety. He has no sleep disturbances but feels tired and loses energy when sitting. He reports no falls, chest pain, shortness of breath, or vision problems.  His wrist strength has improved significantly, and he uses a wrist brace at night, which is beneficial. He has no issues with bowel movements.     He currently lives with: alone  Depression and Anxiety Screen done today and results listed below:     04/10/2024    8:43 AM 03/04/2024    1:18 PM  Depression screen PHQ 2/9  Decreased Interest 0 0  Down, Depressed, Hopeless 0 0  PHQ - 2 Score 0 0  Altered sleeping 0 1  Tired, decreased energy 1 1  Change in appetite 0 0  Feeling bad or failure about yourself  0 0  Trouble concentrating 0 0  Moving slowly or fidgety/restless 0 0  Suicidal thoughts 0  0  PHQ-9 Score 1 2  Difficult doing work/chores Somewhat difficult Somewhat difficult      04/10/2024    8:43 AM 03/04/2024    1:18 PM  GAD 7 : Generalized Anxiety Score  Nervous, Anxious, on Edge 2 1  Control/stop worrying 1 1  Worry too much - different things 1 1  Trouble relaxing 1 1  Restless 0 0  Easily annoyed or irritable 0 0  Afraid - awful might happen 0 0  Total GAD 7 Score 5 4  Anxiety Difficulty Somewhat difficult Somewhat difficult    The patient does not have a history of falls. I did not complete a risk assessment for falls. A plan of care for falls was not documented.   Past Medical History:  Past Medical History:  Diagnosis Date   Allergy    Anxiety    Arthritis    Kidney stones     Surgical History:  Past Surgical History:  Procedure Laterality Date   FRACTURE SURGERY      Medications:  No current outpatient medications on file prior to visit.   No current facility-administered medications on file prior to visit.    Allergies:  No Known Allergies  Social History:  Social History   Socioeconomic History   Marital status: Married    Spouse name: Not on file  Number of children: Not on file   Years of education: Not on file   Highest education level: GED or equivalent  Occupational History   Not on file  Tobacco Use   Smoking status: Never   Smokeless tobacco: Never  Vaping Use   Vaping status: Never Used  Substance and Sexual Activity   Alcohol use: No    Comment: seldom   Drug use: No   Sexual activity: Yes  Other Topics Concern   Not on file  Social History Narrative   Not on file   Social Drivers of Health   Financial Resource Strain: Low Risk  (03/03/2024)   Overall Financial Resource Strain (CARDIA)    Difficulty of Paying Living Expenses: Not hard at all  Food Insecurity: No Food Insecurity (03/03/2024)   Hunger Vital Sign    Worried About Running Out of Food in the Last Year: Never true    Ran Out of Food in the Last  Year: Never true  Transportation Needs: No Transportation Needs (03/03/2024)   PRAPARE - Administrator, Civil Service (Medical): No    Lack of Transportation (Non-Medical): No  Physical Activity: Unknown (03/03/2024)   Exercise Vital Sign    Days of Exercise per Week: 0 days    Minutes of Exercise per Session: Not on file  Stress: No Stress Concern Present (03/03/2024)   [employer] of Occupational Health - Occupational Stress Questionnaire    Feeling of Stress : Only a little  Social Connections: Moderately Isolated (03/03/2024)   Social Connection and Isolation Panel [NHANES]    Frequency of Communication with Friends and Family: More than three times a week    Frequency of Social Gatherings with Friends and Family: More than three times a week    Attends Religious Services: More than 4 times per year    Active Member of [employer] or Organizations: No    Attends Engineer, structural: Not on file    Marital Status: Divorced  Intimate Partner Violence: Unknown (06/26/2023)   Received from [facility]   HITS    Physically Hurt: Not on file    Insult or Talk Down To: Not on file    Threaten Physical Harm: Not on file    Scream or Curse: Not on file   Social History   Tobacco Use  Smoking Status Never  Smokeless Tobacco Never   Social History   Substance and Sexual Activity  Alcohol Use No   Comment: seldom    Family History:  Family History  Problem Relation Age of Onset   Heart attack Mother     Past medical history, surgical history, medications, allergies, family history and social history reviewed with patient today and changes made to appropriate areas of the chart.   Review of Systems  Constitutional:  Positive for malaise/fatigue. Negative for fever.  HENT: Negative.    Eyes: Negative.   Respiratory: Negative.    Cardiovascular: Negative.   Gastrointestinal:  Positive for diarrhea (with anxiety). Negative for abdominal pain, blood in  stool and nausea.  Genitourinary: Negative.   Musculoskeletal: Negative.   Skin: Negative.   Neurological: Negative.   Psychiatric/Behavioral:  Negative for depression. The patient is nervous/anxious. The patient does not have insomnia.    All other ROS negative except what is listed above and in the HPI.      Objective:    BP 120/80 (BP Location: Left Arm, Patient Position: Sitting, Cuff Size: Normal)   Pulse Aaron Aas)  54   Temp (!) 97.1 F (36.2 C)   Ht 6\' 4"  (1.93 m)   Wt 162 lb (73.5 kg)   SpO2 99%   BMI 19.72 kg/m   Wt Readings from Last 3 Encounters:  04/10/24 162 lb (73.5 kg)  03/04/24 163 lb (73.9 kg)  12/15/16 157 lb (71.2 kg)    Physical Exam Vitals and nursing note reviewed.  Constitutional:      General: He is not in acute distress.    Appearance: Normal appearance.  HENT:     Head: Normocephalic and atraumatic.     Right Ear: Tympanic membrane, ear canal and external ear normal.     Left Ear: Tympanic membrane, ear canal and external ear normal.  Eyes:     Conjunctiva/sclera: Conjunctivae normal.  Cardiovascular:     Rate and Rhythm: Normal rate and regular rhythm.     Pulses: Normal pulses.     Heart sounds: Normal heart sounds.  Pulmonary:     Effort: Pulmonary effort is normal.     Breath sounds: Normal breath sounds.  Abdominal:     Palpations: Abdomen is soft.     Tenderness: There is no abdominal tenderness.  Musculoskeletal:        General: Normal range of motion.     Cervical back: Normal range of motion and neck supple. No tenderness.     Right lower leg: No edema.     Left lower leg: No edema.  Lymphadenopathy:     Cervical: No cervical adenopathy.  Skin:    General: Skin is warm and dry.  Neurological:     General: No focal deficit present.     Mental Status: He is alert and oriented to person, place, and time.     Cranial Nerves: No cranial nerve deficit.     Coordination: Coordination normal.     Gait: Gait normal.  Psychiatric:         Mood and Affect: Mood normal.        Behavior: Behavior normal.        Thought Content: Thought content normal.        Judgment: Judgment normal.     Results for orders placed or performed in visit on 03/04/24  [facility] HIV SCREENING LAB   Collection Time: 03/04/24 12:00 AM  Result Value Ref Range   [facility] HIV Screening Negative - Patient reported   [facility] HEPATITIS C SCREENING LAB   Collection Time: 03/04/24 12:00 AM  Result Value Ref Range   [facility] Hepatitis Screen Negative - Patient Reported       Assessment & Plan:   Problem List Items Addressed This Visit       Nervous and Auditory   Bilateral carpal tunnel syndrome   His condition has improved with the use of a wrist brace. He is awaiting an orthopedic consultation. Continue using the wrist brace at night. Follow up with orthopedics.      Relevant Medications   sertraline (ZOLOFT) 25 MG tablet     Other   Generalized anxiety disorder   Chronic, not controlled. He experiences high anxiety levels. Cymbalta  was discontinued due to side effects. Zoloft was discussed to help with anxiety. Prescribe Zoloft 25mg  once daily, either in the morning or at night, with or without food. Follow up in 4-6 weeks to assess response. Instruct him to contact if severe side effects occur. Discussed potential for nausea and headache, which should resolve in days.      Relevant Medications  sertraline (ZOLOFT) 25 MG tablet   Other Relevant Orders   TSH   Chronic pain of left ankle   Chronic, improving. Continue regular stretches.       Relevant Medications   sertraline (ZOLOFT) 25 MG tablet   Routine general medical examination at a health care facility - Primary   Health maintenance reviewed and updated. Discussed nutrition, exercise. Check CMP, CBC today. Follow-up 1 year.        Relevant Orders   CBC with Differential/Platelet   Comprehensive metabolic panel with GFR   Other Visit Diagnoses       Screening for cardiovascular condition        Screen lipid panel today   Relevant Orders   Lipid panel        IMMUNIZATIONS:   - Tdap: Tetanus vaccination status reviewed: last tetanus booster within 10 years. - Influenza: Postponed to flu season - Pneumovax: Not applicable - Prevnar: Not applicable - HPV: Not applicable - Shingrix vaccine: Not applicable  SCREENING: - Colonoscopy: Not applicable  Discussed with patient purpose of the colonoscopy is to detect colon cancer at curable precancerous or early stages   - AAA Screening: Not applicable   PATIENT COUNSELING:    Sexuality: Discussed sexually transmitted diseases, partner selection, use of condoms, avoidance of unintended pregnancy  and contraceptive alternatives.   Advised to avoid cigarette smoking.  I discussed with the patient that most people either abstain from alcohol or drink within safe limits (<=14/week and <=4 drinks/occasion for males, <=7/weeks and <= 3 drinks/occasion for females) and that the risk for alcohol disorders and other health effects rises proportionally with the number of drinks per week and how often a drinker exceeds daily limits.  Discussed cessation/primary prevention of drug use and availability of treatment for abuse.   Diet: Encouraged to adjust caloric intake to maintain  or achieve ideal body weight, to reduce intake of dietary saturated fat and total fat, to limit sodium intake by avoiding high sodium foods and not adding table salt, and to maintain adequate dietary potassium and calcium preferably from fresh fruits, vegetables, and low-fat dairy products.    stressed the importance of regular exercise  Injury prevention: Discussed safety belts, safety helmets, smoke detector, smoking near bedding or upholstery.   Dental health: Discussed importance of regular tooth brushing, flossing, and dental visits.   Follow up plan: NEXT PREVENTATIVE PHYSICAL DUE IN 1 YEAR. Return in about 4 weeks (around 05/08/2024) for 4-6 weeks ,  Anxiety.  Amna Welker A Shekela Goodridge

## 2024-04-10 NOTE — Assessment & Plan Note (Signed)
 Chronic, improving. Continue regular stretches.

## 2024-04-10 NOTE — Assessment & Plan Note (Signed)
 Chronic, not controlled. He experiences high anxiety levels. Cymbalta  was discontinued due to side effects. Zoloft was discussed to help with anxiety. Prescribe Zoloft 25mg  once daily, either in the morning or at night, with or without food. Follow up in 4-6 weeks to assess response. Instruct him to contact if severe side effects occur. Discussed potential for nausea and headache, which should resolve in days.

## 2024-05-15 ENCOUNTER — Ambulatory Visit: Payer: Self-pay | Admitting: Nurse Practitioner

## 2024-05-24 ENCOUNTER — Encounter: Payer: Self-pay | Admitting: Nurse Practitioner

## 2024-06-05 ENCOUNTER — Ambulatory Visit: Payer: Self-pay | Admitting: Nurse Practitioner

## 2024-06-21 ENCOUNTER — Telehealth: Payer: Self-pay | Admitting: Physician Assistant

## 2024-06-21 ENCOUNTER — Encounter: Payer: Self-pay | Admitting: Nurse Practitioner

## 2024-06-21 DIAGNOSIS — J309 Allergic rhinitis, unspecified: Secondary | ICD-10-CM | POA: Diagnosis not present

## 2024-06-21 MED ORDER — FLUTICASONE PROPIONATE 50 MCG/ACT NA SUSP: 2.0000 | Freq: Every day | NASAL | 6 refills | Status: AC

## 2024-06-21 MED ORDER — LEVOCETIRIZINE DIHYDROCHLORIDE 5 MG PO TABS: 5.0000 mg | ORAL_TABLET | Freq: Every evening | ORAL | 0 refills | Status: AC

## 2024-06-21 NOTE — Progress Notes (Signed)
 I have spent 5 minutes in review of e-visit questionnaire, review and updating patient chart, medical decision making and response to patient.   Laure Kidney, PA-C

## 2024-06-21 NOTE — Progress Notes (Signed)
E visit for Allergic Rhinitis We are sorry that you are not feeling well.  Here is how we plan to help!  Based on what you have shared with me it looks like you have Allergic Rhinitis.  Rhinitis is when a reaction occurs that causes nasal congestion, runny nose, sneezing, and itching.  Most types of rhinitis are caused by an inflammation and are associated with symptoms in the eyes ears or throat. There are several types of rhinitis.  The most common are acute rhinitis, which is usually caused by a viral illness, allergic or seasonal rhinitis, and nonallergic or year-round rhinitis.  Nasal allergies occur certain times of the year.  Allergic rhinitis is caused when allergens in the air trigger the release of histamine in the body.  Histamine causes itching, swelling, and fluid to build up in the fragile linings of the nasal passages, sinuses and eyelids.  An itchy nose and clear discharge are common.  I recommend the following over the counter treatments: Xyzal 5 mg take 1 tablet daily  I also would recommend a nasal spray: Flonase 2 sprays into each nostril once daily   HOME CARE:   You can use an over-the-counter saline nasal spray as needed  Avoid areas where there is heavy dust, mites, or molds  Stay indoors on windy days during the pollen season  Keep windows closed in home, at least in bedroom; use air conditioner.  Use high-efficiency house air filter  Keep windows closed in car, turn AC on re-circulate  Avoid playing out with dog during pollen season  GET HELP RIGHT AWAY IF:   If your symptoms do not improve within 10 days  You become short of breath  You develop yellow or green discharge from your nose for over 3 days  You have coughing fits  MAKE SURE YOU:   Understand these instructions  Will watch your condition  Will get help right away if you are not doing well or get worse  Thank you for choosing an e-visit. Your e-visit answers were reviewed by a  board certified advanced clinical practitioner to complete your personal care plan. Depending upon the condition, your plan could have included both over the counter or prescription medications. Please review your pharmacy choice. Be sure that the pharmacy you have chosen is open so that you can pick up your prescription now.  If there is a problem you may message your provider in MyChart to have the prescription routed to another pharmacy. Your safety is important to us. If you have drug allergies check your prescription carefully.  For the next 24 hours, you can use MyChart to ask questions about today's visit, request a non-urgent call back, or ask for a work or school excuse from your e-visit provider. You will get an email in the next two days asking about your experience. I hope that your e-visit has been valuable and will speed your recovery.        

## 2024-06-24 ENCOUNTER — Telehealth: Payer: Self-pay | Admitting: Nurse Practitioner

## 2024-06-24 NOTE — Telephone Encounter (Signed)
 05/15/2024 no show 06/05/2024 no show  Sent final warning letter via mail and mychart.

## 2024-06-25 NOTE — Telephone Encounter (Signed)
 Noted

## 2024-07-19 ENCOUNTER — Ambulatory Visit: Payer: Self-pay | Admitting: Nurse Practitioner

## 2024-08-19 ENCOUNTER — Ambulatory Visit: Admitting: Nurse Practitioner

## 2024-12-20 ENCOUNTER — Ambulatory Visit: Payer: Self-pay

## 2024-12-20 NOTE — Telephone Encounter (Signed)
 I called and spoke with patient and asked if I could get him scheduled to be seen sooner. Appointment scheduled for 12/25/24 at 4pm.

## 2024-12-20 NOTE — Telephone Encounter (Signed)
 FYI Only or Action Required?: FYI only for provider: appointment scheduled on 01/01/25.  Patient was last seen in primary care on 04/10/2024 by Nedra Tinnie LABOR, NP.  Called Nurse Triage reporting Chest Pain.  Symptoms began several weeks ago.  Interventions attempted: OTC medications: Advil .  Symptoms are: unchanged.  Triage Disposition: See PCP Within 2 Weeks (overriding See Physician Within 24 Hours)  Patient/caregiver understands and will follow disposition?: Yes  Message from [place] [facility] sent at 12/20/2024 12:59 PM EST  Summary: tightness in chest   Reason for Triage: tightness in chest          Reason for Disposition  [1] Chest pain lasts < 5 minutes AND [2] NO chest pain or cardiac symptoms (e.g., breathing difficulty, sweating) now  (Exception: Chest pains that last only a few seconds.)  Answer Assessment - Initial Assessment Questions No SOB/cough, increased stress d/t wife sickness, takes Advil  and gets relief Advised pt to schedule OV for 12/23/24, pt declined and preferred to see PCP, PCP appt availability outside of dispo, scheduled for 01/01/25 and advised pt if sx worsen to call back and schedule with other providers. Pt agreeable.   1. LOCATION: Where does it hurt?       L side chest  2. RADIATION: Does the pain go anywhere else? (e.g., into neck, jaw, arms, back)     no 3. ONSET: When did the chest pain begin? (Minutes, hours or days)      2 weeks  4. PATTERN: Does the pain come and go, or has it been constant since it started?  Does it get worse with exertion?      Comes and goes  5. DURATION: How long does it last (e.g., seconds, minutes, hours)     5 mins max  2 times/ day 6. SEVERITY: How bad is the pain?  (e.g., Scale 1-10; mild, moderate, or severe)     4/5-10 when present and soreness after 5/10 10. OTHER SYMPTOMS: Do you have any other symptoms? (e.g., dizziness, nausea, vomiting, sweating, fever, difficulty breathing, cough)        No  Protocols used: Chest Pain-A-AH

## 2024-12-25 ENCOUNTER — Other Ambulatory Visit (HOSPITAL_COMMUNITY)
Admission: RE | Admit: 2024-12-25 | Discharge: 2024-12-25 | Disposition: A | Source: Ambulatory Visit | Attending: Nurse Practitioner | Admitting: Nurse Practitioner

## 2024-12-25 ENCOUNTER — Ambulatory Visit: Payer: Self-pay | Admitting: Nurse Practitioner

## 2024-12-25 ENCOUNTER — Encounter: Payer: Self-pay | Admitting: Nurse Practitioner

## 2024-12-25 ENCOUNTER — Ambulatory Visit: Admitting: Nurse Practitioner

## 2024-12-25 VITALS — BP 118/76 | HR 73 | Temp 97.4°F | Ht 76.0 in | Wt 167.4 lb

## 2024-12-25 DIAGNOSIS — R079 Chest pain, unspecified: Secondary | ICD-10-CM | POA: Diagnosis not present

## 2024-12-25 DIAGNOSIS — Z113 Encounter for screening for infections with a predominantly sexual mode of transmission: Secondary | ICD-10-CM

## 2024-12-25 DIAGNOSIS — R9431 Abnormal electrocardiogram [ECG] [EKG]: Secondary | ICD-10-CM | POA: Diagnosis not present

## 2024-12-25 DIAGNOSIS — F411 Generalized anxiety disorder: Secondary | ICD-10-CM

## 2024-12-25 MED ORDER — MELOXICAM 15 MG PO TABS: 15.0000 mg | ORAL_TABLET | Freq: Every day | ORAL | 0 refills | Status: AC

## 2024-12-25 NOTE — Assessment & Plan Note (Signed)
 Intermittent chest pain is likely due to costochondritis vs anxiety, worsened by stress and anxiety. Discontinued Aleve and started meloxicam  15mg  once daily with food for at least one week, then as needed. Use ice or heat for symptomatic relief. EKG showed normal sinus rhythm with a heart rate of 63. No ST or T wave changes. LVH noted with no history of hypertension. Check CMP, CBC, TSH today. May be from being young with low normal BMI, will check echo.

## 2024-12-25 NOTE — Progress Notes (Signed)
 "  Acute Visit  BP 118/76 (BP Location: Left Arm, Patient Position: Sitting, Cuff Size: Normal)   Pulse 73   Temp (!) 97.4 F (36.3 C)   Ht 6' 4 (1.93 m)   Wt 167 lb 6.4 oz (75.9 kg)   SpO2 98%   BMI 20.38 kg/m    Subjective:    Patient ID: Derrick Pena, male    DOB: Jul 31, 1991, 34 y.o.   MRN: [id number]  CC: Chief Complaint  Patient presents with   Chest Pain    Hurting for 2 weeks, under some stress    HPI: Derrick Pena is a 34 y.o. male presents for chest pain. This has been ongoing for the past 2 weeks and has not improved with OTC advil . Pain occurs intermittently (for about 5 minutes, twice daily) on the left side of her chest without any radiation and is rated 4-5/10. Denies SOB or cough. He does admit to having some increased stress due to his wife being ill.   He has had significant recent psychosocial stress related to his wife's gallbladder surgery and his brother's severe brain injury, which he feels has increased his stress level.  He is not taking medication specifically for chest pain. He uses Advil  Cold and Flu and sinus medications for allergies, usually one Advil  pill daily. He previously tried Cymbalta  and sertraline  for anxiety but stopped due to side effects. He notes increased stress but no recent anxiety attacks and is not interested in therapy. He sleeps about eight hours without difficulty but feels tired during the day.        12/25/2024    3:47 PM 04/10/2024    8:43 AM 03/04/2024    1:18 PM  Depression screen PHQ 2/9  Decreased Interest 0 0 0  Down, Depressed, Hopeless 0 0 0  PHQ - 2 Score 0 0 0  Altered sleeping 0 0 1  Tired, decreased energy 1 1 1   Change in appetite 0 0 0  Feeling bad or failure about yourself  0 0 0  Trouble concentrating 0 0 0  Moving slowly or fidgety/restless 0 0 0  Suicidal thoughts 0 0 0  PHQ-9 Score 1 1  2    Difficult doing work/chores Not difficult at all Somewhat difficult Somewhat difficult     Data saved with  a previous flowsheet row definition      12/25/2024    3:48 PM 04/10/2024    8:43 AM 03/04/2024    1:18 PM  GAD 7 : Generalized Anxiety Score  Nervous, Anxious, on Edge 1 2  1    Control/stop worrying 1 1  1    Worry too much - different things 1 1  1    Trouble relaxing 1 1  1    Restless 0 0  0   Easily annoyed or irritable 0 0  0   Afraid - awful might happen 0 0  0   Total GAD 7 Score 4 5 4   Anxiety Difficulty Not difficult at all Somewhat difficult Somewhat difficult     Data saved with a previous flowsheet row definition    Past Medical History:  Diagnosis Date   Allergy    Anxiety    Arthritis    Kidney stones     Past Surgical History:  Procedure Laterality Date   FRACTURE SURGERY      Family History  Problem Relation Age of Onset   Heart attack Mother      Social History[1]  Medications Ordered Prior to  Encounter[2]   Review of Systems See pertinent positives and negatives per HPI.     Objective:    BP 118/76 (BP Location: Left Arm, Patient Position: Sitting, Cuff Size: Normal)   Pulse 73   Temp (!) 97.4 F (36.3 C)   Ht 6' 4 (1.93 m)   Wt 167 lb 6.4 oz (75.9 kg)   SpO2 98%   BMI 20.38 kg/m   Wt Readings from Last 3 Encounters:  12/25/24 167 lb 6.4 oz (75.9 kg)  04/10/24 162 lb (73.5 kg)  03/04/24 163 lb (73.9 kg)    BP Readings from Last 3 Encounters:  12/25/24 118/76  04/10/24 120/80  03/04/24 108/78    Physical Exam Vitals and nursing note reviewed.  Constitutional:      Appearance: Normal appearance.  HENT:     Head: Normocephalic.  Eyes:     Conjunctiva/sclera: Conjunctivae normal.  Cardiovascular:     Rate and Rhythm: Normal rate and regular rhythm.     Pulses: Normal pulses.     Heart sounds: Normal heart sounds.  Pulmonary:     Effort: Pulmonary effort is normal.     Breath sounds: Normal breath sounds.  Musculoskeletal:        General: Tenderness (along sternal border bilaterally) present.     Cervical back: Normal range  of motion and neck supple. No tenderness.  Lymphadenopathy:     Cervical: No cervical adenopathy.  Skin:    General: Skin is warm.  Neurological:     General: No focal deficit present.     Mental Status: He is alert and oriented to person, place, and time.  Psychiatric:        Mood and Affect: Mood normal.        Behavior: Behavior normal.        Thought Content: Thought content normal.        Judgment: Judgment normal.       Assessment & Plan:   Problem List Items Addressed This Visit       Other   Generalized anxiety disorder   Increased stress due to personal circumstances. Previous medication trials were not tolerated, and he prefers non-medication management. Monitor anxiety symptoms and consider medication if symptoms persist or worsen.      Chest pain - Primary   Intermittent chest pain is likely due to costochondritis vs anxiety, worsened by stress and anxiety. Discontinued Aleve and started meloxicam  15mg  once daily with food for at least one week, then as needed. Use ice or heat for symptomatic relief. EKG showed normal sinus rhythm with a heart rate of 63. No ST or T wave changes. LVH noted with no history of hypertension. Check CMP, CBC, TSH today. May be from being young with low normal BMI, will check echo.       Relevant Orders   EKG 12-Lead (Completed)   CBC with Differential/Platelet   Comprehensive metabolic panel with GFR   TSH   Other Visit Diagnoses       Screen for STD (sexually transmitted disease)       Screen STD today per request   Relevant Orders   HIV Antibody (routine testing w rflx)   Urine cytology ancillary only     Abnormal EKG       LVH noted on EKG, may be from lower normal BMI and young age. Will check echocardiogram.   Relevant Orders   ECHOCARDIOGRAM COMPLETE       Follow up plan: Return if symptoms  worsen or fail to improve.  Tinnie DELENA Harada, NP  I,Emily Lagle,acting as a scribe for [employer], NP.,have documented  all relevant documentation on the behalf of [name], NP.  I, Tinnie DELENA Harada, NP, have reviewed all documentation for this visit. The documentation on 12/25/2024 for the exam, diagnosis, procedures, and orders are all accurate and complete.     [1]  Social History Tobacco Use   Smoking status: Never   Smokeless tobacco: Never  Vaping Use   Vaping status: Never Used  Substance Use Topics   Alcohol use: No    Comment: seldom   Drug use: No  [2]  Current Outpatient Medications on File Prior to Visit  Medication Sig Dispense Refill   Cetirizine HCl (ZYRTEC PO) Take by mouth daily.     UNABLE TO FIND as needed. Med Name: Advil  and Cold and Flu     fluticasone  (FLONASE ) 50 MCG/ACT nasal spray Place 2 sprays into both nostrils daily. (Patient not taking: Reported on 12/25/2024) 16 g 6   levocetirizine (XYZAL  ALLERGY 24HR) 5 MG tablet Take 1 tablet (5 mg total) by mouth every evening for 14 days. (Patient not taking: Reported on 12/25/2024) 14 tablet 0   No current facility-administered medications on file prior to visit.   "

## 2024-12-25 NOTE — Progress Notes (Signed)
 EKG interpreted by me on 12/25/24 showed normal sinus rhythm with a heart rate of 63. No ST or T wave changes. LVH present

## 2024-12-25 NOTE — Patient Instructions (Signed)
 It was great to see you!  We are checking your labs today and will let you know the results via mychart/phone.   Start meloxicam  1 tablet daily with food for 7 days then as needed.  You can also use ice/heat to the areas   Let's follow-up if symptoms worsen or don't improve   Take care,  Tinnie Harada, NP

## 2024-12-25 NOTE — Assessment & Plan Note (Signed)
 Increased stress due to personal circumstances. Previous medication trials were not tolerated, and he prefers non-medication management. Monitor anxiety symptoms and consider medication if symptoms persist or worsen.

## 2024-12-26 LAB — COMPREHENSIVE METABOLIC PANEL WITH GFR
ALT: 12 U/L (ref 3–53)
AST: 16 U/L (ref 5–37)
Albumin: 4.7 g/dL (ref 3.5–5.2)
Alkaline Phosphatase: 60 U/L (ref 39–117)
BUN: 10 mg/dL (ref 6–23)
CO2: 29 meq/L (ref 19–32)
Calcium: 8.9 mg/dL (ref 8.4–10.5)
Chloride: 103 meq/L (ref 96–112)
Creatinine, Ser: 0.99 mg/dL (ref 0.40–1.50)
GFR: 98.45 mL/min
Glucose, Bld: 88 mg/dL (ref 70–99)
Potassium: 3.8 meq/L (ref 3.5–5.1)
Sodium: 140 meq/L (ref 135–145)
Total Bilirubin: 0.4 mg/dL (ref 0.2–1.2)
Total Protein: 7.2 g/dL (ref 6.0–8.3)

## 2024-12-26 LAB — CBC WITH DIFFERENTIAL/PLATELET
Basophils Absolute: 0.1 K/uL (ref 0.0–0.1)
Basophils Relative: 1.3 % (ref 0.0–3.0)
Eosinophils Absolute: 0.1 K/uL (ref 0.0–0.7)
Eosinophils Relative: 1.7 % (ref 0.0–5.0)
HCT: 42.5 % (ref 39.0–52.0)
Hemoglobin: 14.4 g/dL (ref 13.0–17.0)
Lymphocytes Relative: 25.9 % (ref 12.0–46.0)
Lymphs Abs: 2 K/uL (ref 0.7–4.0)
MCHC: 33.9 g/dL (ref 30.0–36.0)
MCV: 86.1 fl (ref 78.0–100.0)
Monocytes Absolute: 0.6 K/uL (ref 0.1–1.0)
Monocytes Relative: 8.3 % (ref 3.0–12.0)
Neutro Abs: 4.8 K/uL (ref 1.4–7.7)
Neutrophils Relative %: 62.8 % (ref 43.0–77.0)
Platelets: 204 K/uL (ref 150.0–400.0)
RBC: 4.94 Mil/uL (ref 4.22–5.81)
RDW: 12.5 % (ref 11.5–15.5)
WBC: 7.6 K/uL (ref 4.0–10.5)

## 2024-12-26 LAB — HIV ANTIBODY (ROUTINE TESTING W REFLEX)
HIV 1&2 Ab, 4th Generation: NONREACTIVE
HIV FINAL INTERPRETATION: NEGATIVE

## 2024-12-26 LAB — URINE CYTOLOGY ANCILLARY ONLY
Chlamydia: NEGATIVE
Comment: NEGATIVE
Comment: NORMAL
Neisseria Gonorrhea: NEGATIVE

## 2024-12-26 LAB — TSH: TSH: 1.15 u[IU]/mL (ref 0.35–5.50)

## 2024-12-30 ENCOUNTER — Ambulatory Visit (HOSPITAL_COMMUNITY)

## 2025-01-01 ENCOUNTER — Ambulatory Visit: Admitting: Nurse Practitioner

## 2025-01-28 ENCOUNTER — Ambulatory Visit (HOSPITAL_COMMUNITY)
